# Patient Record
Sex: Male | Born: 1990 | Race: White | Hispanic: No | Marital: Single | Smoking: Current every day smoker
Health system: Southern US, Community
[De-identification: ages and names within clinical notes are randomized; demographics above are authoritative.]

---
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
## 2012-06-08 ENCOUNTER — Emergency Department: Payer: Self-pay | Admitting: *Deleted

## 2013-06-13 ENCOUNTER — Emergency Department: Payer: Self-pay | Admitting: Emergency Medicine

## 2013-06-14 LAB — CBC WITH DIFFERENTIAL/PLATELET
Basophil #: 0 10*3/uL (ref 0.0–0.1)
Basophil %: 0.5 %
Eosinophil #: 0 10*3/uL (ref 0.0–0.7)
HGB: 14.8 g/dL (ref 13.0–18.0)
Lymphocyte %: 26.2 %
MCV: 86 fL (ref 80–100)
Monocyte %: 6.5 %
Neutrophil #: 3.9 10*3/uL (ref 1.4–6.5)
Neutrophil %: 66 %
Platelet: 128 10*3/uL — ABNORMAL LOW (ref 150–440)
RDW: 13.1 % (ref 11.5–14.5)
WBC: 6 10*3/uL (ref 3.8–10.6)

## 2013-06-14 LAB — COMPREHENSIVE METABOLIC PANEL
Albumin: 4.5 g/dL (ref 3.8–5.6)
Alkaline Phosphatase: 111 U/L (ref 98–317)
Anion Gap: 5 — ABNORMAL LOW (ref 7–16)
BUN: 14 mg/dL (ref 7–18)
Bilirubin,Total: 0.4 mg/dL (ref 0.2–1.0)
Chloride: 106 mmol/L (ref 98–107)
Co2: 29 mmol/L (ref 21–32)
Creatinine: 0.99 mg/dL (ref 0.60–1.30)
EGFR (Non-African Amer.): >60
Osmolality: 279 (ref 275–301)
Potassium: 3.8 mmol/L (ref 3.5–5.1)
SGPT (ALT): 77 U/L (ref 12–78)

## 2013-06-14 LAB — MONONUCLEOSIS SCREEN: Mono Test: NEGATIVE

## 2013-06-16 LAB — BETA STREP CULTURE(ARMC)

## 2013-08-21 IMAGING — CR DG RIBS 2V*L*
1 series · 4 of 4 positions shown · non-contrast
Comparison: none

REASON FOR EXAM: pain, trauma
COMMENTS:

PROCEDURE:     DXR - DXR RIBS LEFT UNILATERAL  - June 09, 2012  [DATE]
RESULT:     There is no evidence of fracture, dislocation, or malalignment.

[Series 1: w ribs ap upper left · 0.14mm/px · 4 of 4 slices shown]
[im 1/4]
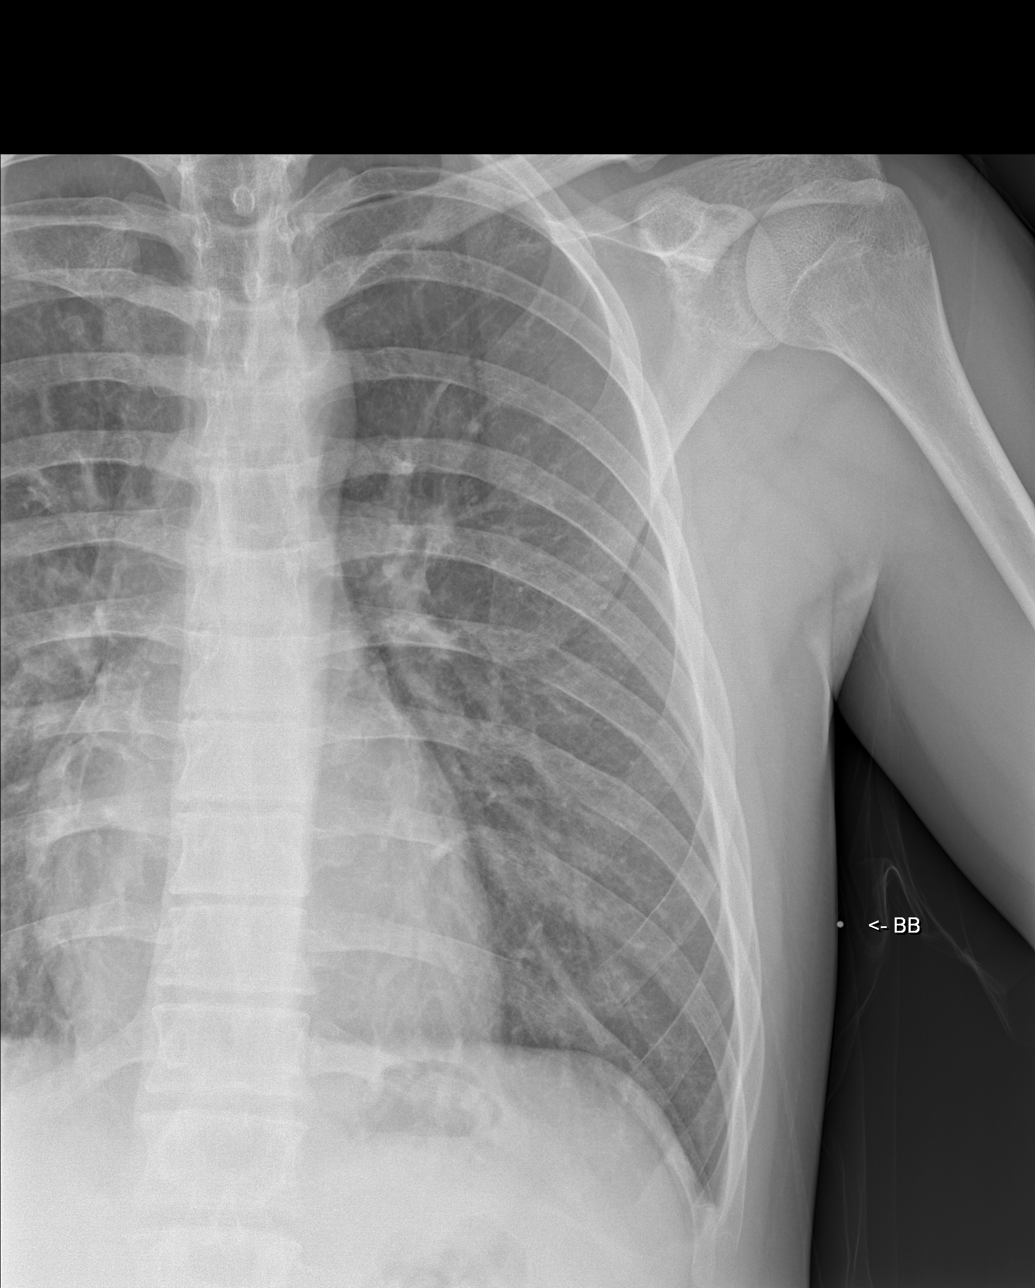
[im 2/4]
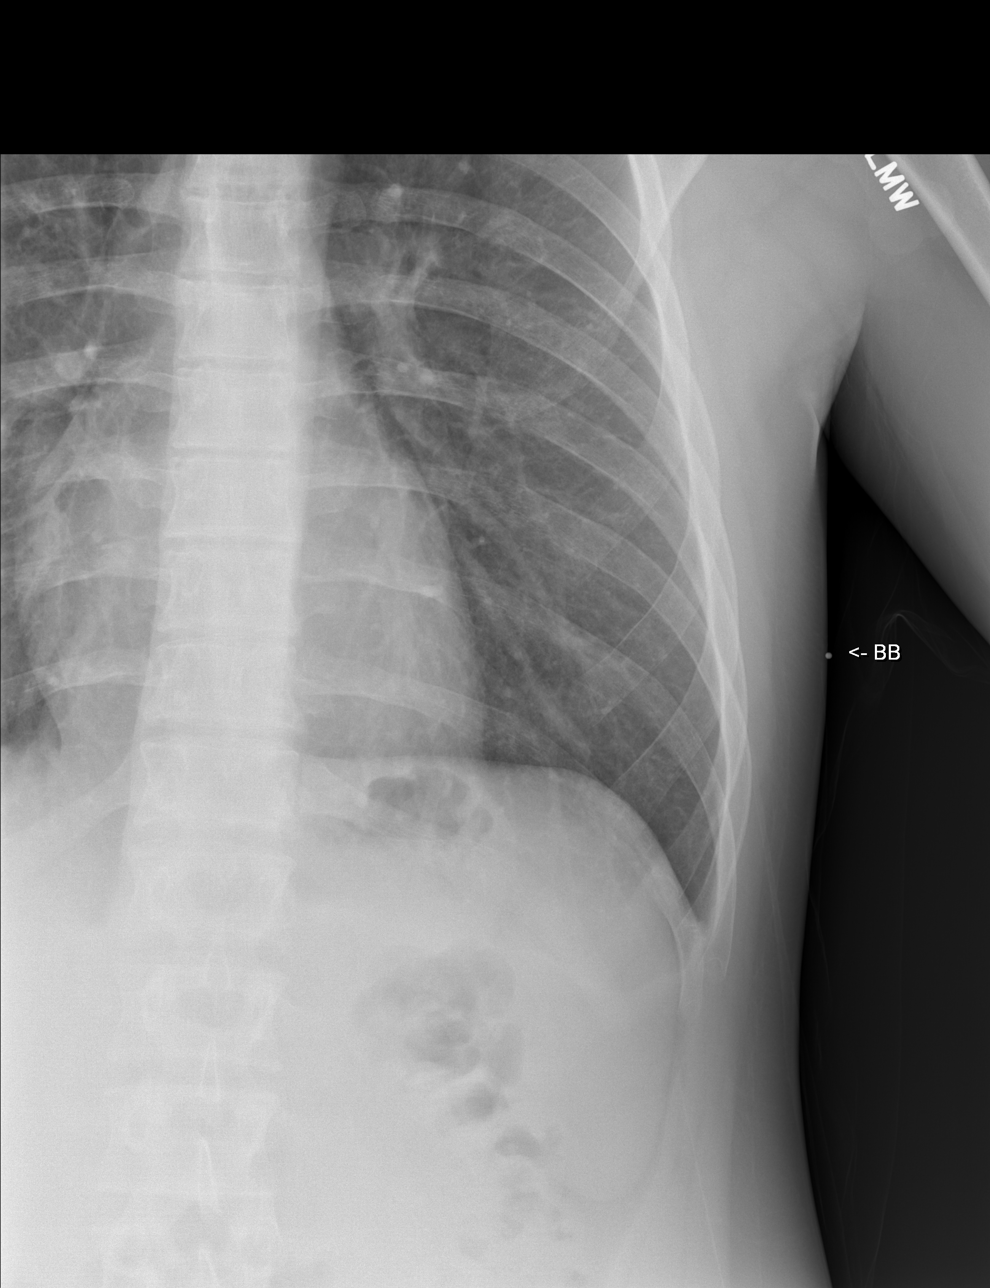
[im 3/4]
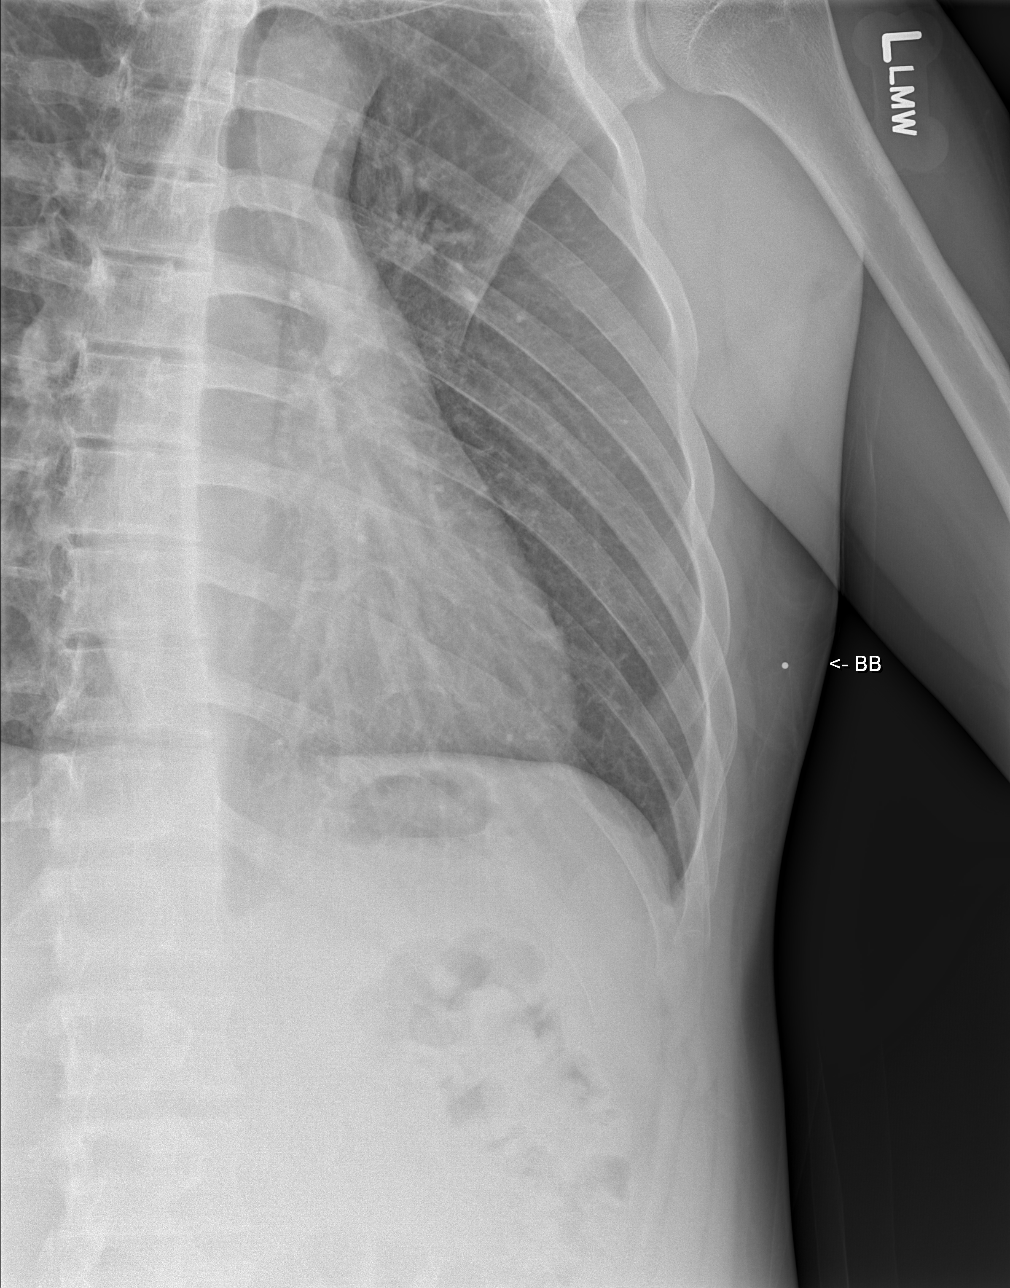
[im 4/4]
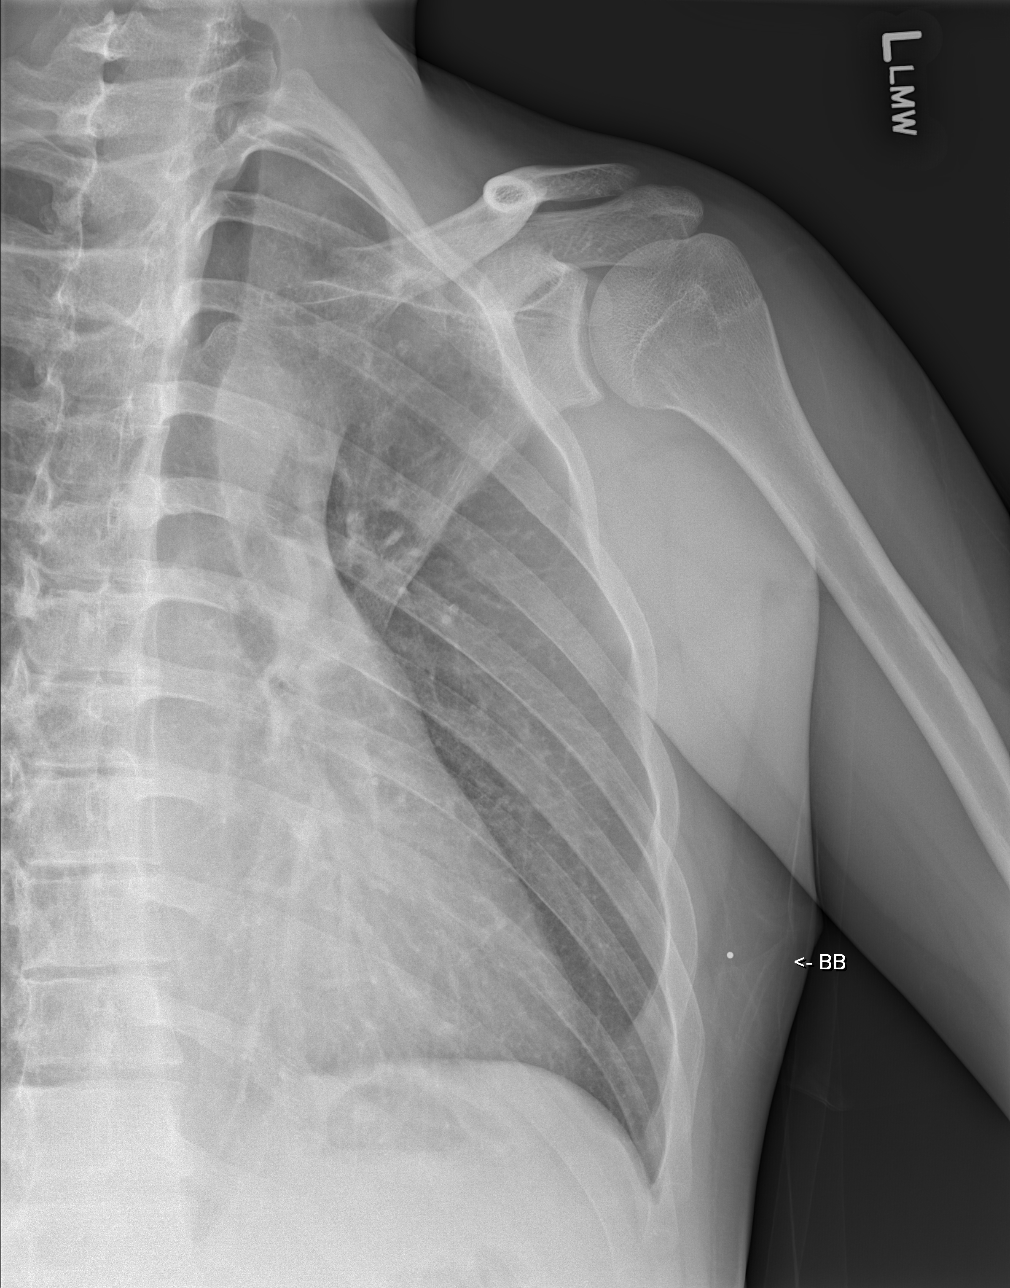

[4 of 4 positions shown; findings below may reference images not displayed]

IMPRESSION: 1. No evidence of acute abnormalities.
2. If there are persistent complaints of pain or persistent clinical
concern, a repeat evaluation in 7-10 days is recommended if clinically
warranted.

## 2014-11-02 ENCOUNTER — Emergency Department: Payer: Self-pay | Admitting: Student

## 2016-01-14 IMAGING — CR DG RIBS 2V*L*
1 series · 4 of 4 positions shown · non-contrast
Comparison: Left rib radiographs 06/09/2012.

CLINICAL DATA: Fell down 10 steps earlier today. Back pain.
Posterior rib but scratch the posterior left rib pain.

EXAM:
LEFT RIBS - 2 VIEW

[Series 1: dxr ribs left unilateral · 0.14mm/px · 4 of 4 slices shown]
[im 1/4]
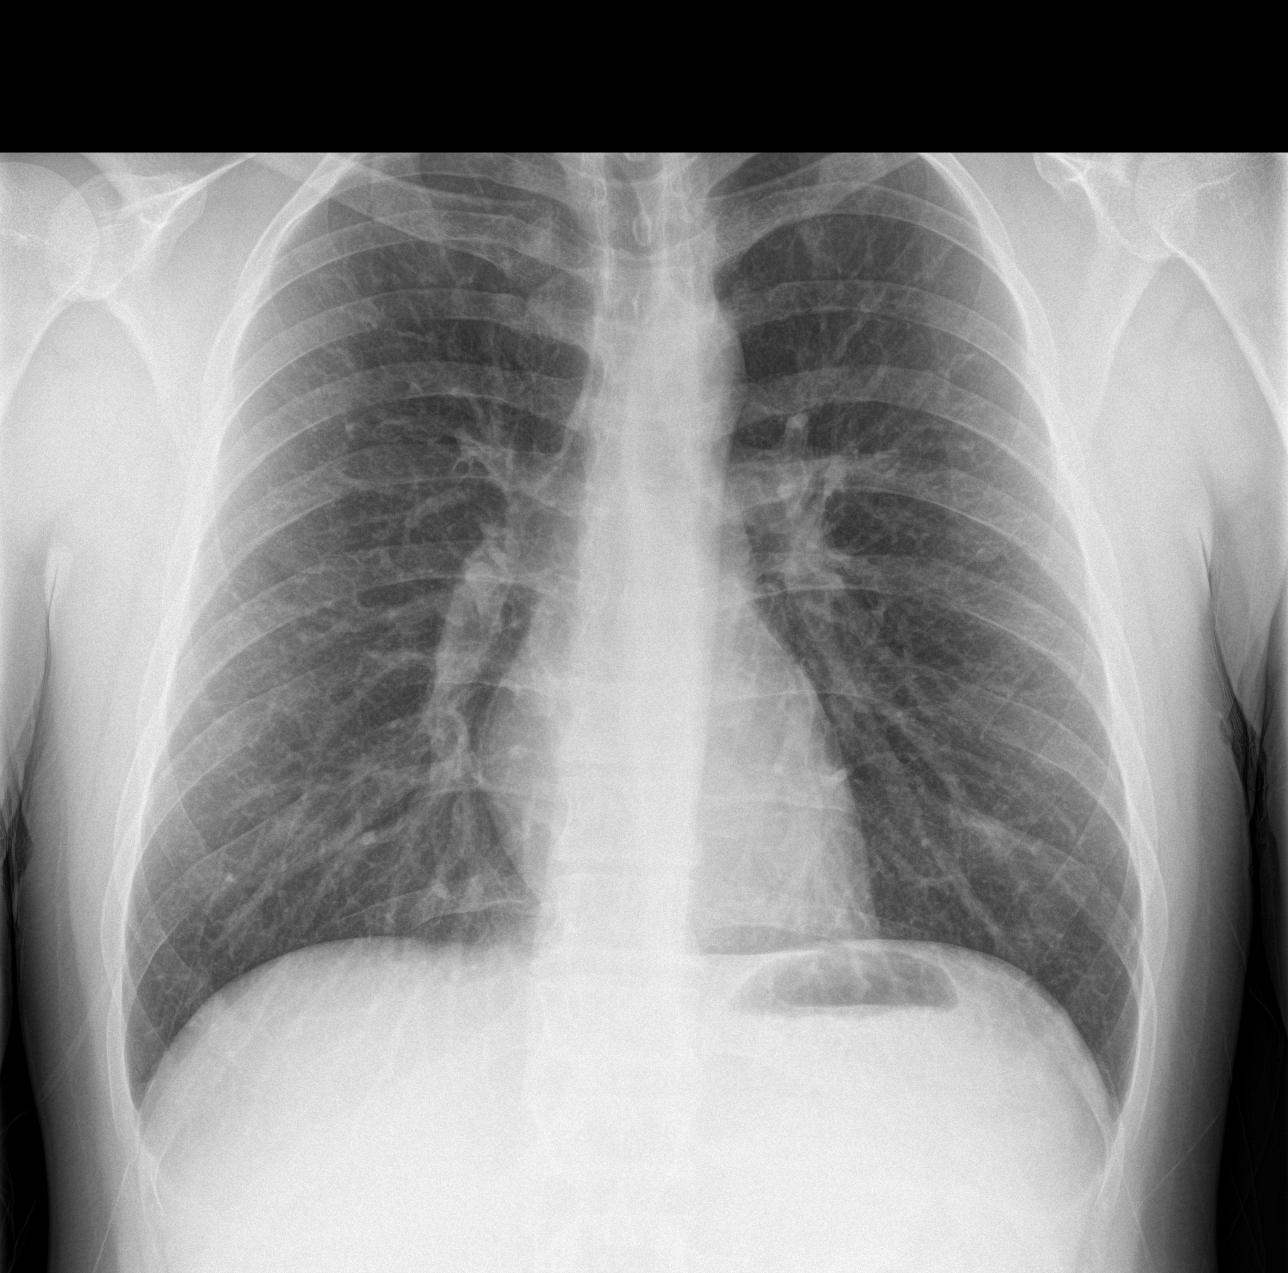
[im 2/4]
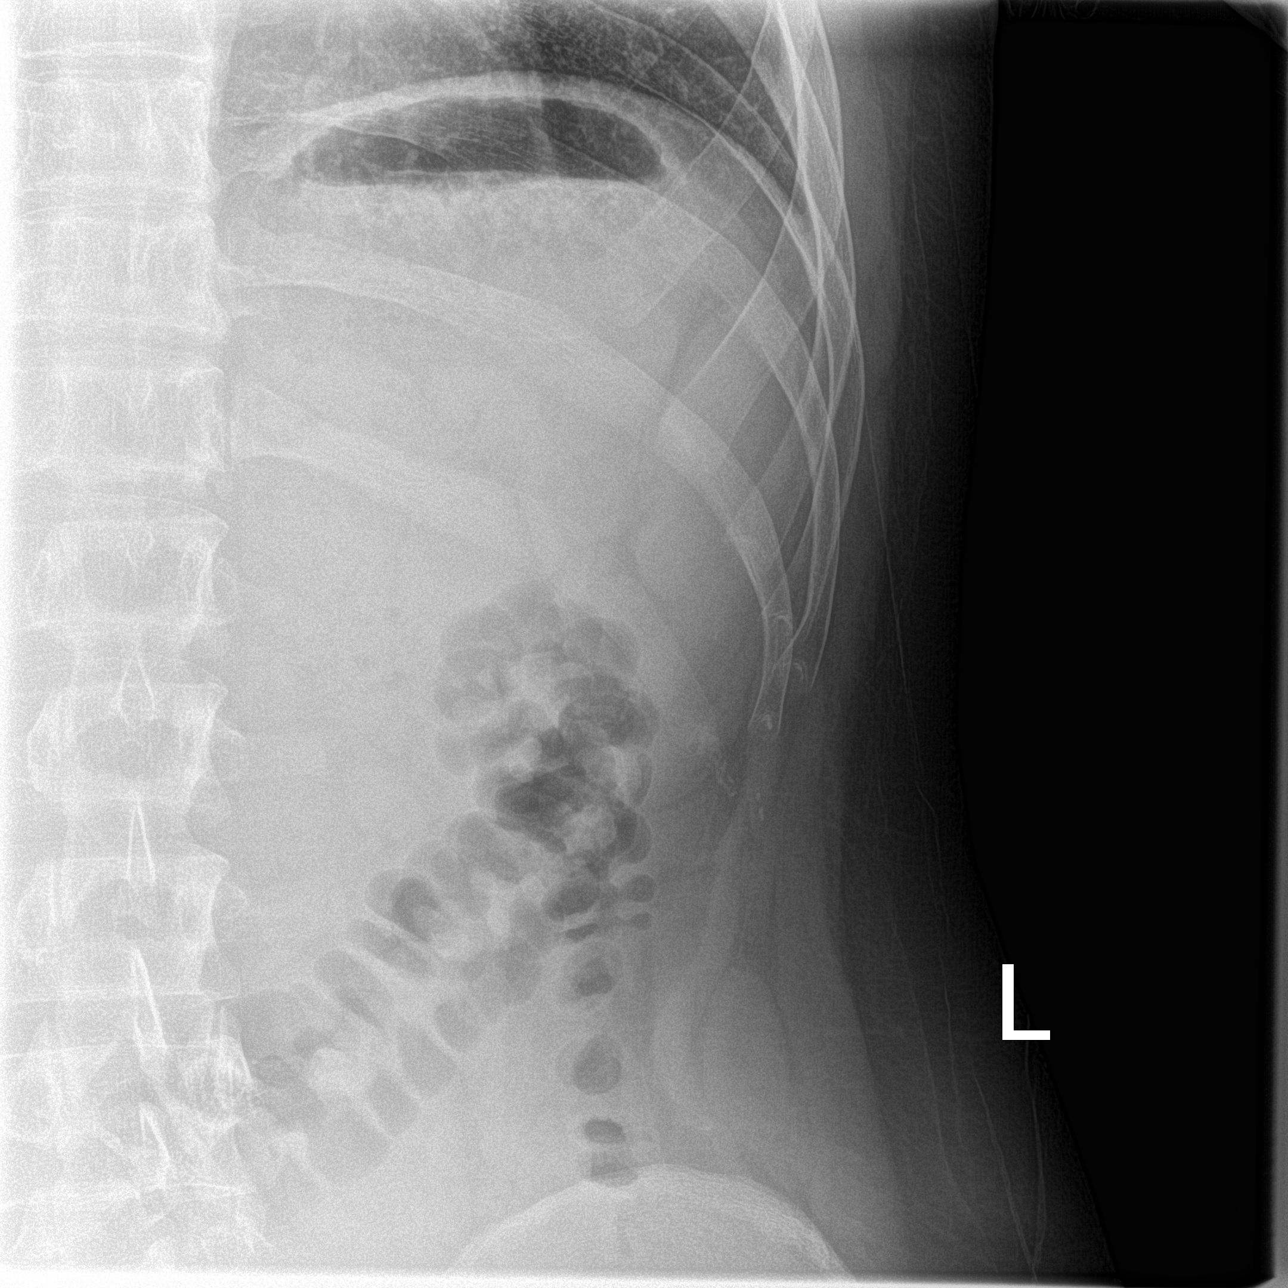
[im 3/4]
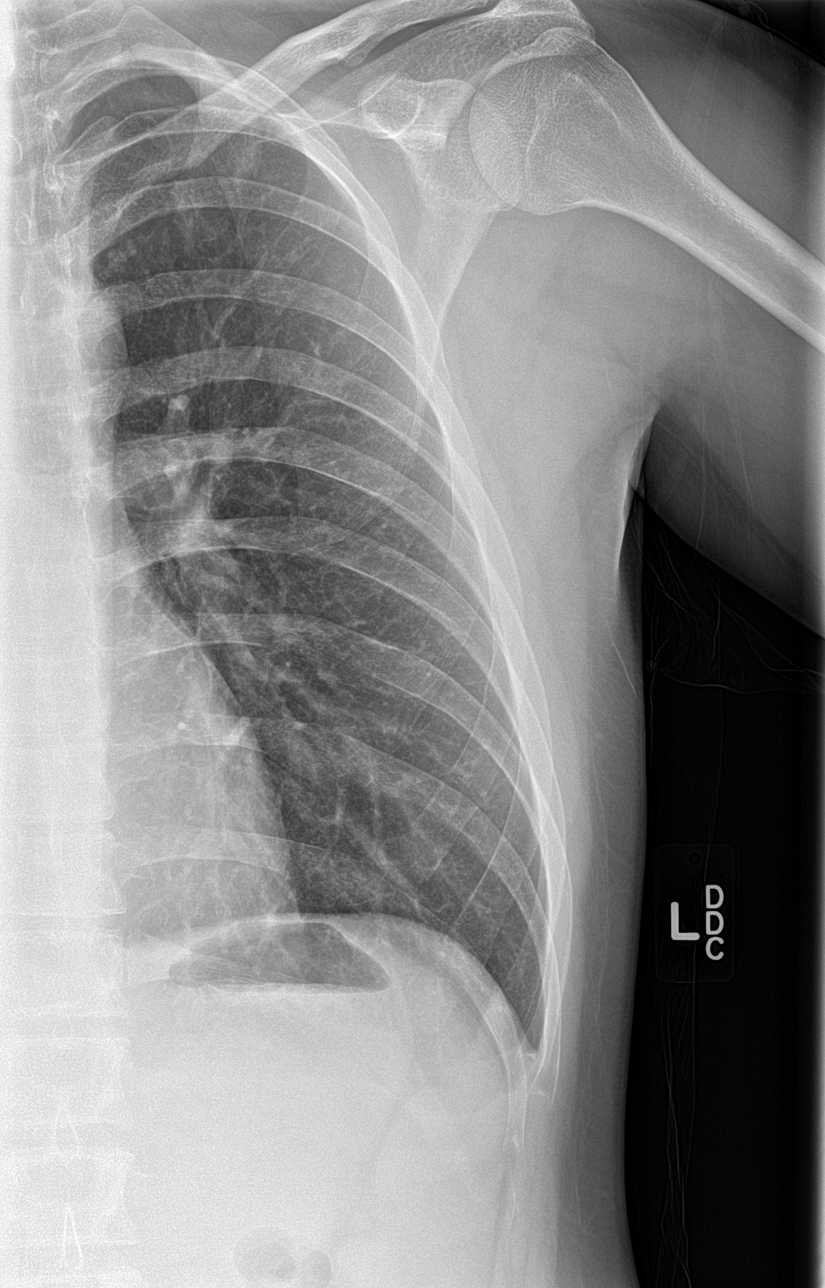
[im 4/4]
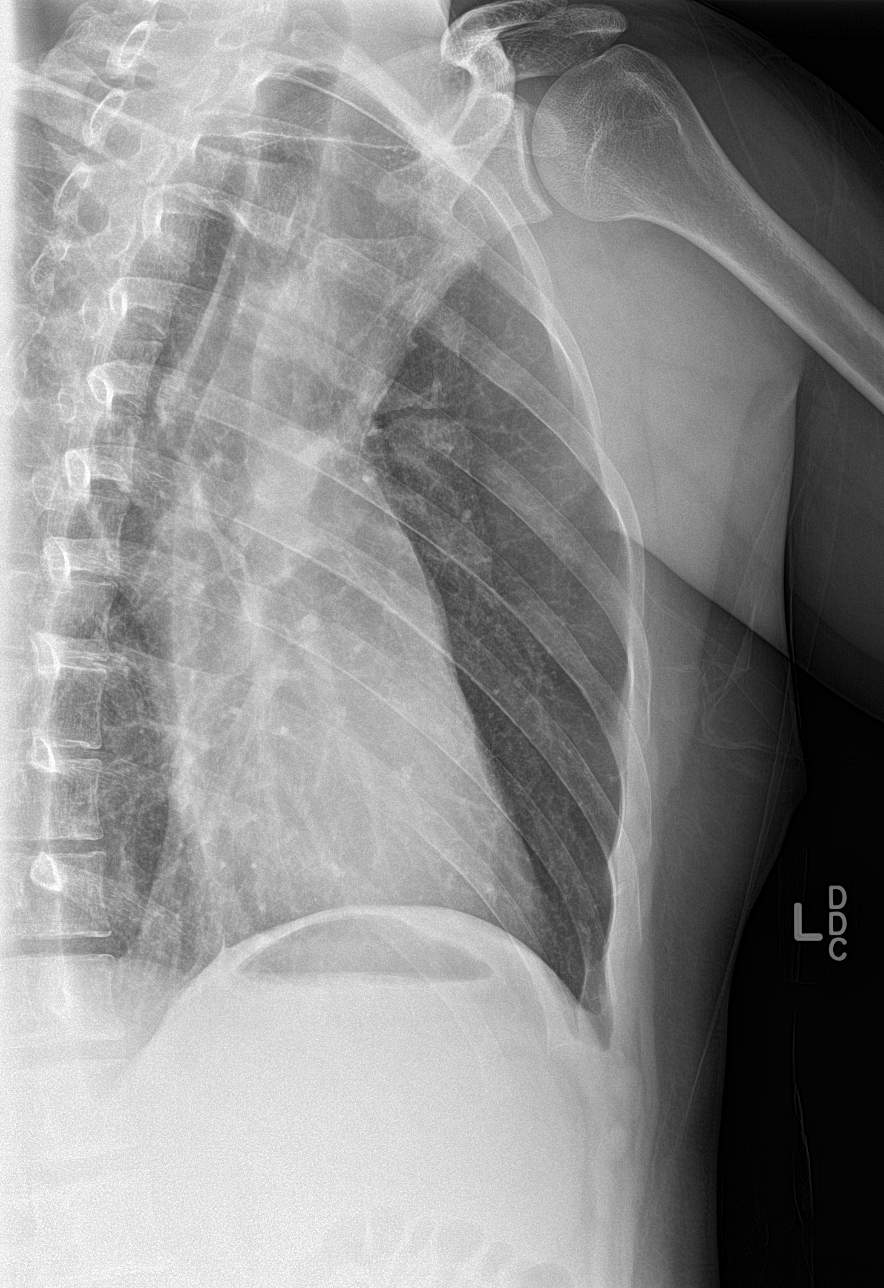

[4 of 4 positions shown; findings below may reference images not displayed]

FINDINGS: The heart size is normal.  The lungs are clear.

Dedicated imaging of the left ribs demonstrates no acute or healing
fracture. There is no pneumothorax. The visualized soft tissues and
bony thorax are otherwise unremarkable.
IMPRESSION: Negative one-view chest x-ray and left ribs.

## 2018-07-02 ENCOUNTER — Emergency Department: Payer: Self-pay

## 2018-07-02 ENCOUNTER — Encounter: Payer: Self-pay | Admitting: Emergency Medicine

## 2018-07-02 DIAGNOSIS — S63502A Unspecified sprain of left wrist, initial encounter: Secondary | ICD-10-CM | POA: Insufficient documentation

## 2018-07-02 DIAGNOSIS — Y929 Unspecified place or not applicable: Secondary | ICD-10-CM | POA: Insufficient documentation

## 2018-07-02 DIAGNOSIS — Y999 Unspecified external cause status: Secondary | ICD-10-CM | POA: Insufficient documentation

## 2018-07-02 DIAGNOSIS — Y9389 Activity, other specified: Secondary | ICD-10-CM | POA: Insufficient documentation

## 2018-07-02 DIAGNOSIS — F17210 Nicotine dependence, cigarettes, uncomplicated: Secondary | ICD-10-CM | POA: Insufficient documentation
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
## 2018-07-03 ENCOUNTER — Emergency Department
Admission: EM | Admit: 2018-07-03 | Discharge: 2018-07-03 | Disposition: A | Discharge status: Home / Self Care | Payer: Self-pay | Attending: Emergency Medicine | Admitting: Emergency Medicine

## 2018-07-03 DIAGNOSIS — S63502A Unspecified sprain of left wrist, initial encounter: Secondary | ICD-10-CM

## 2018-07-03 MED ORDER — TRAMADOL HCL 50 MG PO TABS
50.0000 mg | ORAL_TABLET | Freq: Once | ORAL | Status: AC
  Administered 2018-07-03: 50 mg via ORAL
  Filled 2018-07-03: qty 1

## 2018-07-03 MED ORDER — TRAMADOL HCL 50 MG PO TABS: 50.0000 mg | ORAL_TABLET | Freq: Four times a day (QID) | ORAL | 0 refills | Status: AC | PRN

## 2018-07-03 NOTE — Discharge Instructions (Addendum)
Please seek medical attention for any high fevers, chest pain, shortness of breath, change in behavior, persistent vomiting, bloody stool or any other new or concerning symptoms.  
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
## 2019-10-07 ENCOUNTER — Emergency Department
Admission: EM | Admit: 2019-10-07 | Discharge: 2019-10-07 | Disposition: A | Discharge status: Home / Self Care | Payer: Self-pay | Attending: Emergency Medicine | Admitting: Emergency Medicine

## 2019-10-07 ENCOUNTER — Encounter: Payer: Self-pay | Admitting: Emergency Medicine

## 2019-10-07 ENCOUNTER — Other Ambulatory Visit: Payer: Self-pay

## 2019-10-07 DIAGNOSIS — K0889 Other specified disorders of teeth and supporting structures: Secondary | ICD-10-CM | POA: Insufficient documentation

## 2019-10-07 DIAGNOSIS — F17210 Nicotine dependence, cigarettes, uncomplicated: Secondary | ICD-10-CM | POA: Insufficient documentation

## 2019-10-07 MED ORDER — PENICILLIN V POTASSIUM 500 MG PO TABS: 500.0000 mg | ORAL_TABLET | Freq: Four times a day (QID) | ORAL | 0 refills | Status: DC

## 2019-10-07 NOTE — Discharge Instructions (Addendum)
OPTIONS FOR DENTAL FOLLOW UP CARE ° °[place] Department of Health and Human Services - Local Safety Net Dental Clinics °http://www.ncdhhs.gov/dph/oralhealth/services/safetynetclinics.htm °  °[facility] (336-562-3123) ° °[facility] [place] ([phone]) ° °[facility] ([phone] ext [phone]) ° °[facility] ([phone]) ° °[facility] ([phone]) °This clinic caters to the indigent population and is on a lottery system. °Location: °[facility], [address], [place] °Clinic Hours: °Wednesdays from 6pm - 9pm, patients seen by a lottery system. °For dates, call or go to [email or web address].[facility].edu/[facility]/patients/Dental-[facility] °Services: °Cleanings, fillings and simple extractions. °Payment Options: °DENTAL WORK IS FREE OF CHARGE. Bring proof of income or support. °Best way to get seen: °Arrive at 5:15 pm - this is a lottery, NOT first come/first serve, so arriving earlier will not increase your chances of being seen. °  °  °[facility] °[phone] °Select option 1 for emergencies °  °Location: °[facility], [address], [place] °Clinic Hours: °No walk-ins accepted - call the day before to schedule an appointment. °Check in times are 9:30 am and 1:30 pm. °Services: °Simple extractions, temporary fillings, pulpectomy/pulp debridement, uncomplicated abscess drainage. °Payment Options: °PAYMENT IS DUE AT THE TIME OF SERVICE.  Fee is usually $100-200, additional surgical procedures (e.g. abscess drainage) may be extra. °Cash, checks, Visa/MasterCard accepted.  Can file Medicaid if patient is covered for dental - patient should call case worker to check. °No discount for [facility] patients. °Best way to get seen: °MUST call the day before and get onto the schedule. Can usually be seen the next 1-2 days. No walk-ins accepted. °  °  °[facility] °[phone] °   °Location: °[facility], [address], [place] °Clinic Hours: °M, W, Th, F 8am or 1:30pm, Tues 9a or 1:30 - first come/first served. °Services: °Simple extractions, temporary fillings, uncomplicated abscess drainage.  You do not need to be an [place] resident. °Payment Options: °PAYMENT IS DUE AT THE TIME OF SERVICE. °Dental insurance, otherwise sliding scale - bring proof of income or support. °Depending on income and treatment needed, cost is usually $50-200. °Best way to get seen: °Arrive early as it is first come/first served. °  °  °[facility] °[phone] °  °Location: °[address] °Clinic Hours: °Mon-Thu 8a-5p °Services: °Most basic dental services including extractions and fillings. °Payment Options: °PAYMENT IS DUE AT THE TIME OF SERVICE. °Sliding scale, up to 50% off - bring proof if income or support. °Medicaid with dental option accepted. °Best way to get seen: °Call to schedule an appointment, can usually be seen within 2 weeks OR they will try to see walk-ins - show up at 8a or 2p (you may have to wait). °  °  °[facility] °[phone] °[place] RESIDENTS ONLY °  °Location: °[facility], [address], [place], [place] [address] °Clinic Hours: By appointment only. °Monday - Thursday 8am-5pm, Friday 8am-12pm °Services: Cleanings, fillings, extractions. °Payment Options: °PAYMENT IS DUE AT THE TIME OF SERVICE. °Cash, Visa or MasterCard. Sliding scale - $30 minimum per service. °Best way to get seen: °Come in to office, complete packet and make an appointment - need proof of income °or support monies for each household member and proof of [place] residence. °Usually takes about a month to get in. °  °  °[facility] °[phone] °  °Location: °[address]   [place] °Clinic Hours: Walk-in Urgent Care Dental Services are offered Monday-Friday  mornings only. °The numbers of emergencies accepted daily is limited to the number of °providers available. °Maximum 15 - Mondays, Wednesdays & Thursdays °Maximum 10 - Tuesdays & Fridays °Services: °You do not need to be a [place] resident to be seen for a dental emergency. °Emergencies are defined as pain, swelling, abnormal bleeding, or dental trauma. Walkins will receive x-rays if needed. °NOTE: Dental cleaning is not an emergency. °Payment Options: °PAYMENT IS DUE AT THE TIME OF SERVICE. °Minimum co-pay is $40.00 for uninsured patients. °Minimum co-pay is $3.00 for Medicaid with dental coverage. °Dental Insurance is accepted and must be presented at time of visit. °Medicare does not cover dental. °Forms of payment: Cash, credit card, checks. °Best way to get seen: °If not previously registered with the clinic, walk-in dental registration begins at 7:15 am and is on a first come/first serve basis. °If previously registered with the clinic, call to make an appointment. °  °  °[facility] °[phone] °[place] RESIDENTS ONLY °  °Location: °[address], [place], [place] °Clinic Hours: °Mon-Thu 10a-2p °Services: Extractions only! °Payment Options: °FREE (donations accepted) - bring proof of income or support °Best way to get seen: °Call and schedule an appointment OR come at 8am on the 1st Monday of every month (except for holidays) when it is first come/first served. °  °  °[facility] °[phone] °  °Location: °[address], [place] °Clinic Hours: °Friday mornings °Services, Payment Options, Best way to get seen: °Call for info °

## 2019-10-07 NOTE — ED Notes (Signed)
Says he has a bad tooth on left side with cavity and broke off.  Was ok this am, but went to drink h20 at work and it has become painful.

## 2019-10-07 NOTE — ED Provider Notes (Signed)
[facility] Emergency Department Provider Note  Time seen: 1:41 PM  I have reviewed the triage vital signs and the nursing notes.   HISTORY  Chief Complaint Dental Pain   HPI Adam Travis is a 28 y.o. male with no past medical history presents to the emergency department for left lower dental pain.  According to the patient for many months or possibly years he has had a fractured left lower wisdom tooth.  However he states today while drinking something he felt significant pain to the area which has continued.  Patient denies any fever cough or shortness of breath.   History reviewed. No pertinent past medical history.  There are no active problems to display for this patient.   History reviewed. No pertinent surgical history.  Prior to Admission medications   Medication Sig Start Date End Date Taking? Authorizing Provider  penicillin v potassium (VEETID) 500 MG tablet Take 1 tablet (500 mg total) by mouth 4 (four) times daily. 10/07/19   Minna Antis, MD    No Known Allergies  No family history on file.  Social History Social History   Tobacco Use  . Smoking status: Current Every Day Smoker  . Smokeless tobacco: Never Used  Substance Use Topics  . Alcohol use: Not Currently  . Drug use: Not Currently    Review of Systems Constitutional: Negative for fever. Cardiovascular: Negative for chest pain. Respiratory: Negative for shortness of breath. Gastrointestinal: Negative for abdominal pain Musculoskeletal: Negative for musculoskeletal complaints Neurological: Negative for headache All other ROS negative  ____________________________________________   PHYSICAL EXAM:  VITAL SIGNS: ED Triage Vitals  Enc Vitals Group     BP 10/07/19 1301 107/68     Pulse Rate 10/07/19 1301 63     Resp 10/07/19 1301 20     Temp 10/07/19 1301 98.3 F (36.8 C)     Temp Source 10/07/19 1301 Oral     SpO2 10/07/19 1301 99 %     Weight 10/07/19 1259  200 lb (90.7 kg)     Height 10/07/19 1259 6\' 1"  (1.854 m)     Head Circumference --      Peak Flow --      Pain Score --      Pain Loc --      Pain Edu? --      Excl. in GC? --    Constitutional: Alert and oriented. Well appearing and in no distress. Eyes: Normal exam ENT      Head: Normocephalic and atraumatic.      Mouth/Throat: Mucous membranes are moist.  Patient has a fractured left lower wisdom tooth that extends below the gumline.  No signs of abscess. Cardiovascular: Normal rate, regular rhythm.  Respiratory: Normal respiratory effort without tachypnea nor retractions. Breath sounds are clear  Gastrointestinal: Soft and nontender. No distention.   Musculoskeletal: Nontender with normal range of motion in all extremities. Neurologic:  Normal speech and language. No gross focal neurologic deficits  Skin:  Skin is warm, dry and intact.  Psychiatric: Mood and affect are normal.   ____________________________________________   INITIAL IMPRESSION / ASSESSMENT AND PLAN / ED COURSE  Pertinent labs & imaging results that were available during my care of the patient were reviewed by me and considered in my medical decision making (see chart for details).   Patient presents to the emergency department for left lower molar pain has a fracture extending below the gumline.  No sign of abscess.  We will place  the patient on antibiotics penicillin 4 times daily for the next 10 days.  Patient does not currently have a dentist I provided a list of local dental clinics for the patient.  I discussed my normal return precautions and emphasized the need to follow-up with a dentist as soon as possible.  Patient agreeable to plan of care.  Adam Travis was evaluated in Emergency Department on 10/07/2019 for the symptoms described in the history of present illness. He was evaluated in the context of the global COVID-19 pandemic, which necessitated consideration that the patient might be at risk for  infection with the SARS-CoV-2 virus that causes COVID-19. Institutional protocols and algorithms that pertain to the evaluation of patients at risk for COVID-19 are in a state of rapid change based on information released by regulatory bodies including the CDC and federal and state organizations. These policies and algorithms were followed during the patient's care in the ED.  ____________________________________________   FINAL CLINICAL IMPRESSION(S) / ED DIAGNOSES  Dental pain Dental fracture   Harvest Dark, MD 10/07/19 1343

## 2019-10-07 NOTE — ED Triage Notes (Signed)
Pt reports pain to his left lower wisdom teeth. Pt reports does not have a dentist at this time.

## 2020-08-10 ENCOUNTER — Encounter: Payer: Self-pay | Admitting: Emergency Medicine

## 2020-08-10 ENCOUNTER — Emergency Department
Admission: EM | Admit: 2020-08-10 | Discharge: 2020-08-10 | Disposition: A | Discharge status: Home / Self Care | Payer: Self-pay | Attending: Emergency Medicine | Admitting: Emergency Medicine

## 2020-08-10 ENCOUNTER — Other Ambulatory Visit: Payer: Self-pay

## 2020-08-10 DIAGNOSIS — L237 Allergic contact dermatitis due to plants, except food: Secondary | ICD-10-CM | POA: Insufficient documentation

## 2020-08-10 DIAGNOSIS — F17200 Nicotine dependence, unspecified, uncomplicated: Secondary | ICD-10-CM | POA: Insufficient documentation

## 2020-08-10 MED ORDER — DEXAMETHASONE SODIUM PHOSPHATE 10 MG/ML IJ SOLN
10.0000 mg | Freq: Once | INTRAMUSCULAR | Status: AC
  Administered 2020-08-10: 10 mg via INTRAMUSCULAR
  Filled 2020-08-10: qty 1

## 2020-08-10 MED ORDER — PREDNISONE 10 MG (48) PO TBPK: ORAL_TABLET | Freq: Every day | ORAL | 0 refills | Status: AC

## 2020-08-10 NOTE — ED Triage Notes (Signed)
Pt reports poison ivy rash over his body for 2 days continuing to spread and get worse. Pt states has been using OTC lotions with no relief.

## 2020-08-10 NOTE — ED Provider Notes (Signed)
[facility] Emergency Department Provider Note   ____________________________________________   First MD Initiated Contact with Patient 08/10/20 [phone]     (approximate)  I have reviewed the triage vital signs and the nursing notes.   HISTORY  Chief Complaint Poison Ivy   HPI Cardale R Kroft is a 29 y.o. male presents to the ED with complaint of poison ivy rash on his body for 2 days that is spreading.  Patient has been using over-the-counter lotions without any relief.       History reviewed. No pertinent past medical history.  There are no problems to display for this patient.   History reviewed. No pertinent surgical history.  Prior to Admission medications   Medication Sig Start Date End Date Taking? Authorizing Provider  predniSONE (STERAPRED UNI-PAK 48 TAB) 10 MG (48) TBPK tablet Take by mouth daily. 08/10/20   Tommi Rumps, PA-C    Allergies Patient has no known allergies.  History reviewed. No pertinent family history.  Social History Social History   Tobacco Use  . Smoking status: Current Every Day Smoker  . Smokeless tobacco: Never Used  Substance Use Topics  . Alcohol use: Not Currently  . Drug use: Not Currently    Review of Systems Constitutional: No fever/chills Eyes: No visual changes. Cardiovascular: Denies chest pain. Respiratory: Denies shortness of breath. Gastrointestinal: No abdominal pain.  No nausea, no vomiting.  Musculoskeletal: Negative for muscle aches. Skin: Positive for rash. Neurological: Negative for headaches, focal weakness or numbness. ____________________________________________   PHYSICAL EXAM:  VITAL SIGNS: ED Triage Vitals [08/10/20 0837]  Enc Vitals Group     BP      Pulse      Resp      Temp      Temp src      SpO2      Weight 220 lb (99.8 kg)     Height 6\' 1"  (1.854 m)     Head Circumference      Peak Flow      Pain Score 0     Pain Loc      Pain Edu?      Excl. in GC?       Constitutional: Alert and oriented. Well appearing and in no acute distress. Eyes: Conjunctivae are normal.  Head: Atraumatic. Nose: No congestion/rhinnorhea. Neck: No stridor.   Cardiovascular: Normal rate, regular rhythm. Grossly normal heart sounds.  Good peripheral circulation. Respiratory: Normal respiratory effort.  No retractions. Lungs CTAB. Musculoskeletal: Moves upper and lower extremities they have difficulty.  Normal gait was noted.. Neurologic:  Normal speech and language. No gross focal neurologic deficits are appreciated. No gait instability. Skin:  Skin is warm, dry.  There are multiple areas of erythematous papules and vesicles present consistent with contact dermatitis. Psychiatric: Mood and affect are normal. Speech and behavior are normal.  ____________________________________________   LABS (all labs ordered are listed, but only abnormal results are displayed)  Labs Reviewed - No data to display  PROCEDURES  Procedure(s) performed (including Critical Care):  Procedures   ____________________________________________   INITIAL IMPRESSION / ASSESSMENT AND PLAN / ED COURSE  As part of my medical decision making, I reviewed the following data within the electronic MEDICAL RECORD NUMBER Notes from prior ED visits and [place] Controlled Substance Database  29 year old male presents to the ED with complaint of exposure to poison ivy with a rash that it continues to spread for the last 2 days.  He has been using over-the-counter  medication without any relief.  Patient was given Decadron 10 mg IM while in the ED.  A Sterapred Dosepak for 12 days was sent to the pharmacy.  He is also aware that he can take Benadryl with this medication if needed for itching.   ____________________________________________   FINAL CLINICAL IMPRESSION(S) / ED DIAGNOSES  Final diagnoses:  Poison oak dermatitis     ED Discharge Orders         Ordered    predniSONE (STERAPRED UNI-PAK  48 TAB) 10 MG (48) TBPK tablet  Daily        08/10/20 1017          *Please note:  Myers R Calvin was evaluated in Emergency Department on 08/10/2020 for the symptoms described in the history of present illness. He was evaluated in the context of the global COVID-19 pandemic, which necessitated consideration that the patient might be at risk for infection with the SARS-CoV-2 virus that causes COVID-19. Institutional protocols and algorithms that pertain to the evaluation of patients at risk for COVID-19 are in a state of rapid change based on information released by regulatory bodies including the CDC and federal and state organizations. These policies and algorithms were followed during the patient's care in the ED.  Some ED evaluations and interventions may be delayed as a result of limited staffing during and the pandemic.*   Note:  This document was prepared using Dragon voice recognition software and may include unintentional dictation errors.    Tommi Rumps, PA-C 08/10/20 1428    Merwyn Katos, MD 08/10/20 [phone]

## 2020-08-10 NOTE — ED Notes (Signed)
No reaction noted to the injection site at this time

## 2020-08-10 NOTE — Discharge Instructions (Signed)
Follow-up with your primary care provider or one of the dermatologist listed on your discharge papers.  An injection of Decadron was given to you while in the ED.  Begin taking the prednisone as directed beginning with 6 tablets and tapering down each day as directed.  You may also take Benadryl at night to prevent itching.  You can take 2 tablets which is 50 mg before bedtime.

## 2020-11-10 ENCOUNTER — Emergency Department
Admission: EM | Admit: 2020-11-10 | Discharge: 2020-11-10 | Disposition: A | Discharge status: Home / Self Care | Payer: Self-pay | Attending: Emergency Medicine | Admitting: Emergency Medicine

## 2020-11-10 ENCOUNTER — Encounter: Payer: Self-pay | Admitting: Emergency Medicine

## 2020-11-10 ENCOUNTER — Other Ambulatory Visit: Payer: Self-pay

## 2020-11-10 ENCOUNTER — Emergency Department: Payer: Self-pay

## 2020-11-10 DIAGNOSIS — F17200 Nicotine dependence, unspecified, uncomplicated: Secondary | ICD-10-CM | POA: Insufficient documentation

## 2020-11-10 DIAGNOSIS — S39012A Strain of muscle, fascia and tendon of lower back, initial encounter: Secondary | ICD-10-CM | POA: Insufficient documentation

## 2020-11-10 DIAGNOSIS — X500XXA Overexertion from strenuous movement or load, initial encounter: Secondary | ICD-10-CM | POA: Insufficient documentation

## 2020-11-10 DIAGNOSIS — Y990 Civilian activity done for income or pay: Secondary | ICD-10-CM | POA: Insufficient documentation

## 2020-11-10 MED ORDER — ORPHENADRINE CITRATE 30 MG/ML IJ SOLN
60.0000 mg | Freq: Two times a day (BID) | INTRAMUSCULAR | Status: DC
  Administered 2020-11-10: 60 mg via INTRAMUSCULAR
  Filled 2020-11-10: qty 2

## 2020-11-10 MED ORDER — HYDROMORPHONE HCL 1 MG/ML IJ SOLN
1.0000 mg | Freq: Once | INTRAMUSCULAR | Status: AC
  Administered 2020-11-10: 1 mg via INTRAMUSCULAR
  Filled 2020-11-10: qty 1

## 2020-11-10 MED ORDER — KETOROLAC TROMETHAMINE 60 MG/2ML IM SOLN
60.0000 mg | Freq: Once | INTRAMUSCULAR | Status: AC
  Administered 2020-11-10: 60 mg via INTRAMUSCULAR
  Filled 2020-11-10: qty 2

## 2020-11-10 MED ORDER — TRAMADOL HCL 50 MG PO TABS: 50.0000 mg | ORAL_TABLET | Freq: Four times a day (QID) | ORAL | 0 refills | Status: AC | PRN

## 2020-11-10 MED ORDER — IBUPROFEN 600 MG PO TABS: 600.0000 mg | ORAL_TABLET | Freq: Three times a day (TID) | ORAL | 0 refills | Status: AC | PRN

## 2020-11-10 MED ORDER — CYCLOBENZAPRINE HCL 10 MG PO TABS: 10.0000 mg | ORAL_TABLET | Freq: Three times a day (TID) | ORAL | 0 refills | Status: AC | PRN

## 2020-11-10 NOTE — ED Triage Notes (Signed)
C/O low back pain this morning.  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

## 2020-11-10 NOTE — ED Provider Notes (Signed)
[facility] Emergency Department Provider Note   ____________________________________________   Event Date/Time   First MD Initiated Contact with Patient 11/10/20 1032     (approximate)  I have reviewed the triage vital signs and the nursing notes.   HISTORY  Chief Complaint Back Pain    HPI Adam Travis is a 29 y.o. male patient complain of low back pain secondary to a heavy lifting incident which occurred yesterday.  Patient awakened this morning for increased back pain.  Patient denies radicular component to his back pain.  Patient denies bladder or bowel dysfunction.  Patient rates pain as a 10/10.  Patient described pain as "achy/spasmatic".  No palliative measure for complaint.         History reviewed. No pertinent past medical history.  There are no problems to display for this patient.   History reviewed. No pertinent surgical history.  Prior to Admission medications   Medication Sig Start Date End Date Taking? Authorizing Provider  cyclobenzaprine (FLEXERIL) 10 MG tablet Take 1 tablet (10 mg total) by mouth 3 (three) times daily as needed. 11/10/20   Joni Reining, PA-C  ibuprofen (ADVIL) 600 MG tablet Take 1 tablet (600 mg total) by mouth every 8 (eight) hours as needed. 11/10/20   Joni Reining, PA-C  predniSONE (STERAPRED UNI-PAK 48 TAB) 10 MG (48) TBPK tablet Take by mouth daily. 08/10/20   Tommi Rumps, PA-C  traMADol (ULTRAM) 50 MG tablet Take 1 tablet (50 mg total) by mouth every 6 (six) hours as needed for moderate pain. 11/10/20   Joni Reining, PA-C    Allergies Patient has no known allergies.  No family history on file.  Social History Social History   Tobacco Use  . Smoking status: Current Every Day Smoker  . Smokeless tobacco: Never Used  Substance Use Topics  . Alcohol use: Not Currently  . Drug use: Not Currently    Review of Systems Constitutional: No fever/chills Eyes: No visual changes. ENT:  No sore throat. Cardiovascular: Denies chest pain. Respiratory: Denies shortness of breath. Gastrointestinal: No abdominal pain.  No nausea, no vomiting.  No diarrhea.  No constipation. Genitourinary: Negative for dysuria. Musculoskeletal: Positive for back pain. Skin: Negative for rash. Neurological: Negative for headaches, focal weakness or numbness.  ____________________________________________   PHYSICAL EXAM:  VITAL SIGNS: ED Triage Vitals  Enc Vitals Group     BP 11/10/20 0924 119/66     Pulse Rate 11/10/20 0924 94     Resp 11/10/20 0924 16     Temp 11/10/20 0924 98 F (36.7 C)     Temp Source 11/10/20 0924 Oral     SpO2 11/10/20 0924 98 %     Weight 11/10/20 0922 220 lb 0.3 oz (99.8 kg)     Height 11/10/20 0922 6\' 1"  (1.854 m)     Head Circumference --      Peak Flow --      Pain Score 11/10/20 0922 10     Pain Loc --      Pain Edu? --      Excl. in GC? --    Constitutional: Alert and oriented. Well appearing and in no acute distress. Eyes: Conjunctivae are normal. PERRL. EOMI. Head: Atraumatic. Nose: No congestion/rhinnorhea. Mouth/Throat: Mucous membranes are moist.  Oropharynx non-erythematous. Neck: No stridor.  No cervical spine tenderness to palpation. Hematological/Lymphatic/Immunilogical: No cervical lymphadenopathy. Cardiovascular: Normal rate, regular rhythm. Grossly normal heart sounds.  Good peripheral circulation. Respiratory: Normal respiratory  effort.  No retractions. Lungs CTAB. Gastrointestinal: Soft and nontender. No distention. No abdominal bruits. No CVA tenderness. Musculoskeletal: Has deformity of the lumbar spine.  Patient is moderate guarding palpation 4 through S1.  Patient negative straight leg test bilaterally in the supine position. Neurologic:  Normal speech and language. No gross focal neurologic deficits are appreciated. No gait instability. Skin:  Skin is warm, dry and intact. No rash noted. Psychiatric: Mood and affect are normal.  Speech and behavior are normal.  ____________________________________________   LABS (all labs ordered are listed, but only abnormal results are displayed)  Labs Reviewed - No data to display ____________________________________________  EKG   ____________________________________________  RADIOLOGY I, Joni Reining, personally viewed and evaluated these images (plain radiographs) as part of my medical decision making, as well as reviewing the written report by the radiologist.  ED MD interpretation: No acute findings on x-ray of the lumbar spine.  Official radiology report(s): DG Lumbar Spine 2-3 Views  Result Date: 11/10/2020 CLINICAL DATA:  Acute onset low back pain secondary to lifting a heavy object yesterday. EXAM: LUMBAR SPINE - 2-3 VIEW COMPARISON:  None. FINDINGS: There is transitional lumbosacral anatomy with partial lumbarization of S1 and a rudimentary disc at S1-2. No fracture is identified. There may be minimal anterolisthesis of S1 on S2. Mild disc space narrowing is present at L4-5 and L5-S1. IMPRESSION: 1. No acute osseous abnormality identified. 2. Mild lower lumbar disc degeneration. Electronically Signed   By: Sebastian Ache M.D.   On: 11/10/2020 11:12    ____________________________________________   PROCEDURES  Procedure(s) performed (including Critical Care):  Procedures   ____________________________________________   INITIAL IMPRESSION / ASSESSMENT AND PLAN / ED COURSE  As part of my medical decision making, I reviewed the following data within the electronic MEDICAL RECORD NUMBER         She presents with acute low back pain secondary to heavy lifting incident yesterday.  Discussed no acute findings on x-ray of the lumbar spine.  Patient complaint physical exam consistent with acute lumbar strain.  Patient given discharge care instruction advised on drug effects of pain medication and muscle relaxers.  Patient [facility]      ____________________________________________   FINAL CLINICAL IMPRESSION(S) / ED DIAGNOSES  Final diagnoses:  Strain of lumbar region, initial encounter     ED Discharge Orders         Ordered    traMADol (ULTRAM) 50 MG tablet  Every 6 hours PRN        11/10/20 1121    cyclobenzaprine (FLEXERIL) 10 MG tablet  3 times daily PRN        11/10/20 1121    ibuprofen (ADVIL) 600 MG tablet  Every 8 hours PRN        11/10/20 1121          *Please note:  Adam Travis was evaluated in Emergency Department on 11/10/2020 for the symptoms described in the history of present illness. He was evaluated in the context of the global COVID-19 pandemic, which necessitated consideration that the patient might be at risk for infection with the SARS-CoV-2 virus that causes COVID-19. Institutional protocols and algorithms that pertain to the evaluation of patients at risk for COVID-19 are in a state of rapid change based on information released by regulatory bodies including the CDC and federal and state organizations. These policies and algorithms were followed during the patient's care in the ED.  Some ED evaluations and  interventions may be delayed as a result of limited staffing during and the pandemic.*   Note:  This document was prepared using Dragon voice recognition software and may include unintentional dictation errors.    Joni Reining, PA-C 11/10/20 1133    Shaune Pollack, MD 11/10/20 [phone]

## 2020-11-10 NOTE — ED Notes (Signed)
Pt c/o back pain across the lower back since picking up a concrete mixer yesterday, states it is non radiating pain.

## 2020-11-10 NOTE — Discharge Instructions (Signed)
No acute findings x-ray of the lumbar spine.  Follow discharge care instructions take medication as directed.  Be advised most relaxers and pain medication may cause drowsiness.  Do not operate machinery or drive while taking these medications.

## 2022-01-22 IMAGING — CR DG LUMBAR SPINE 2-3V
3 series · 3 of 3 positions shown · non-contrast
Comparison: None.

CLINICAL DATA: Acute onset low back pain secondary to lifting a
heavy object yesterday.

EXAM:
LUMBAR SPINE - 2-3 VIEW

[l-spine ap]
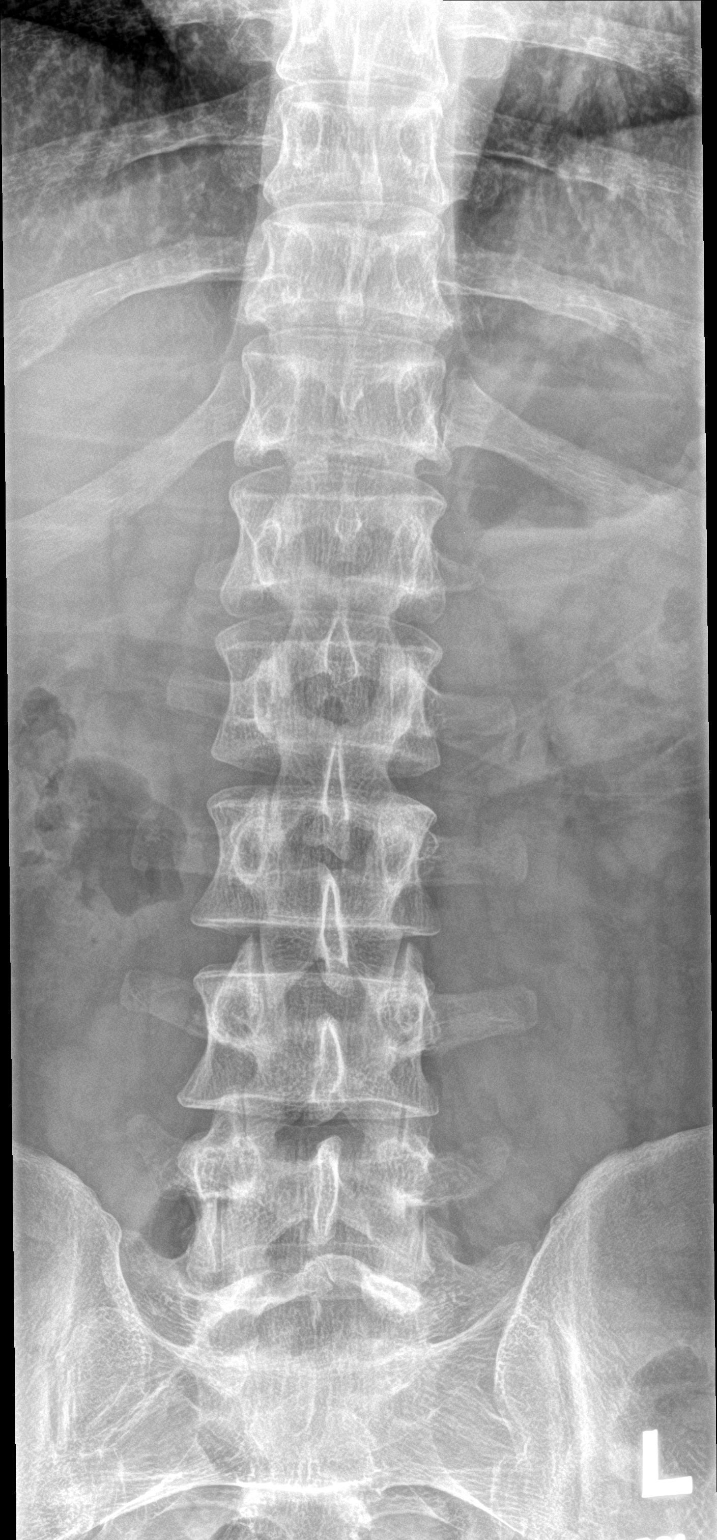

[l-spine lat]
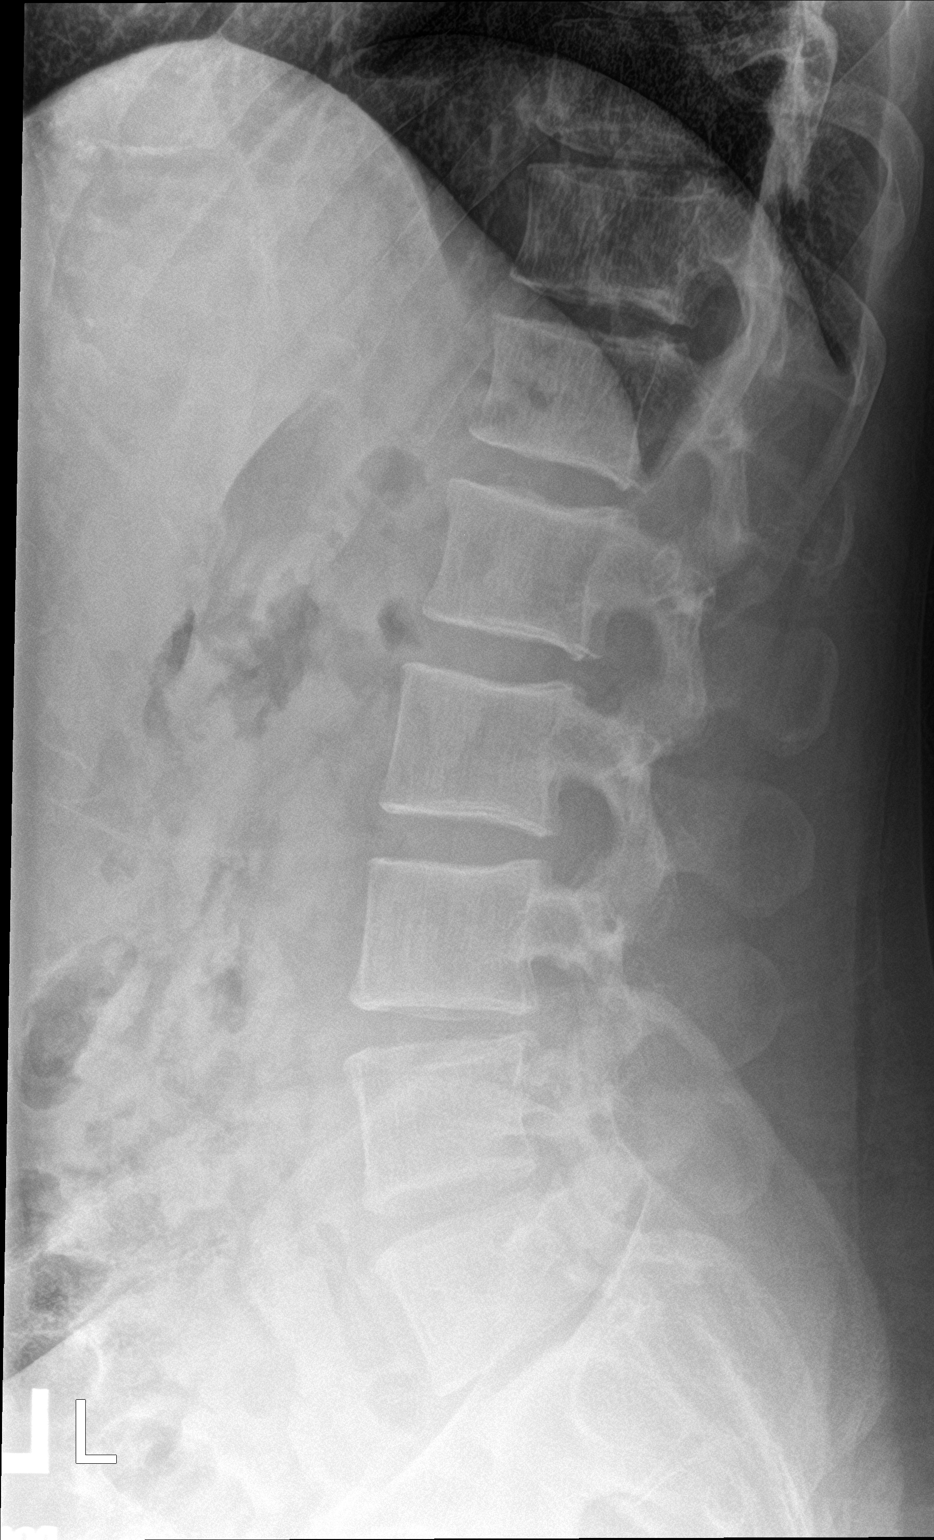

[l-spine spot]
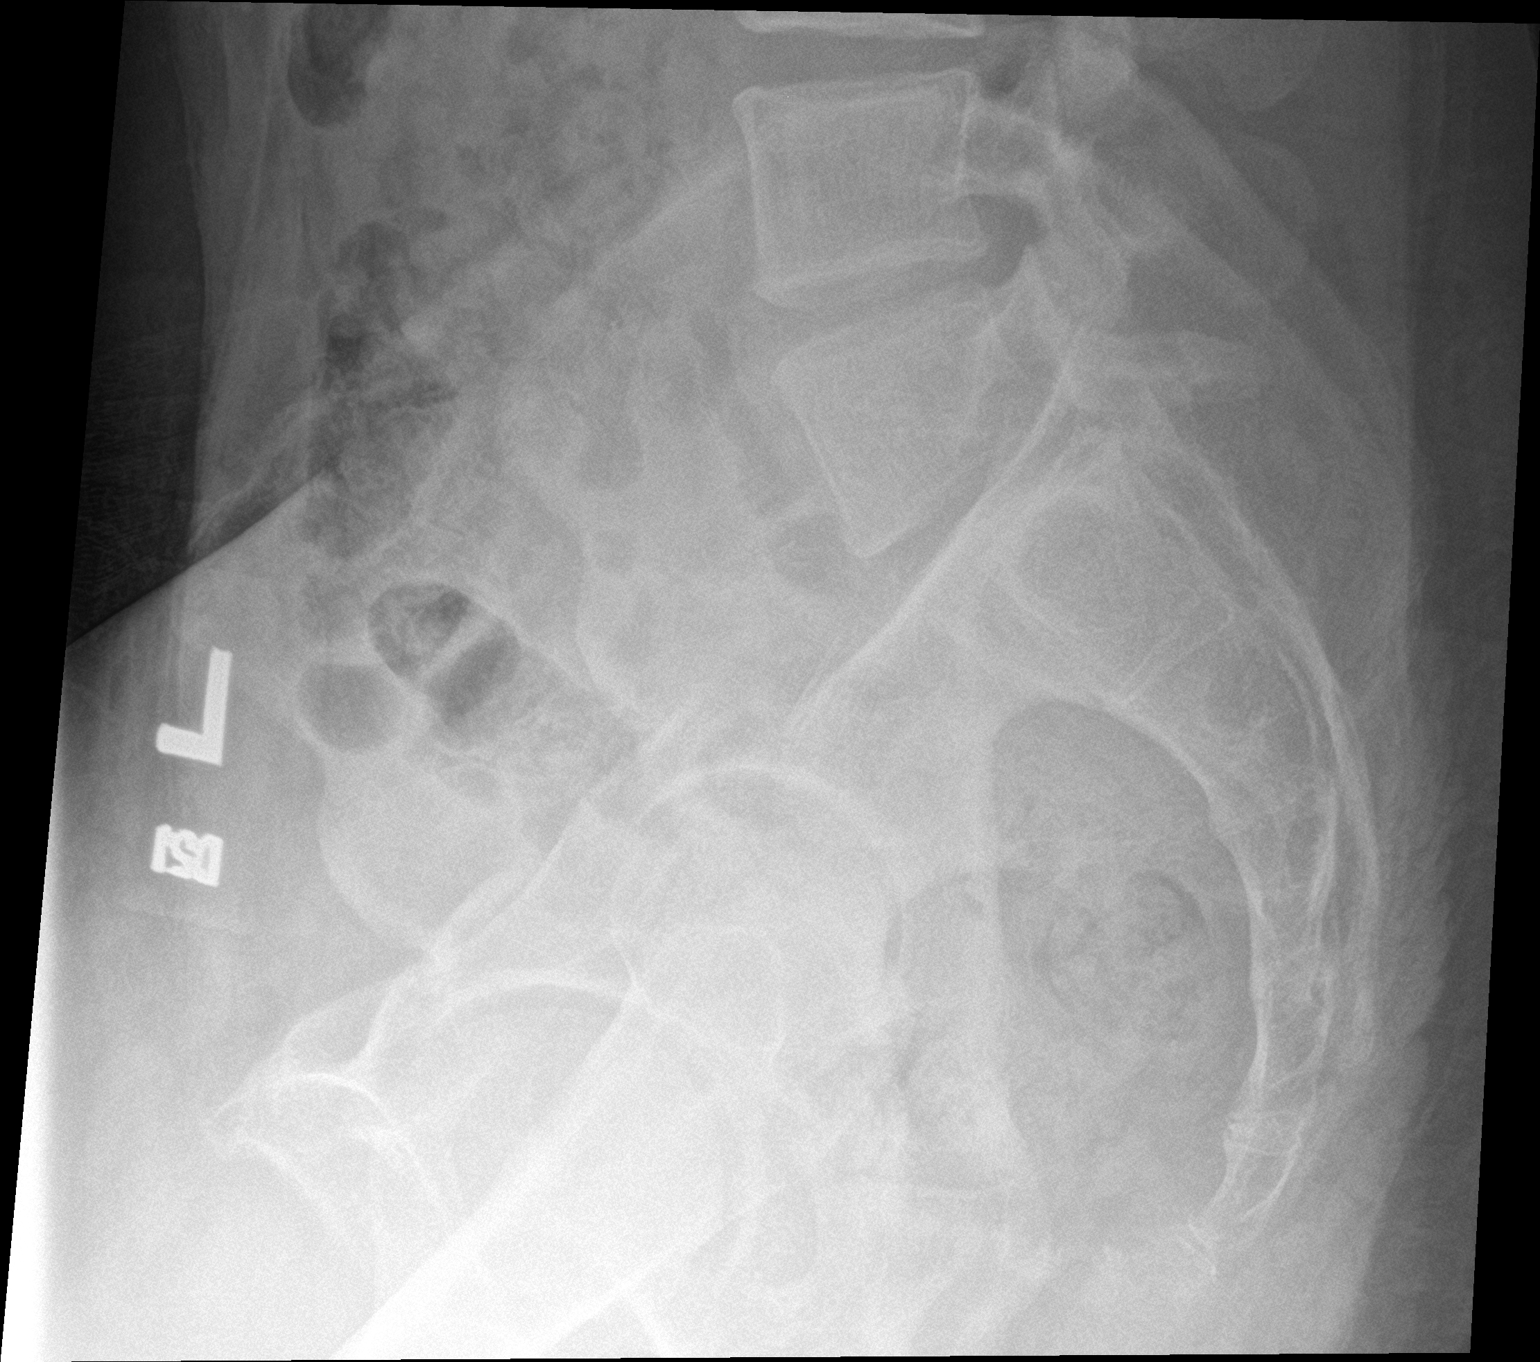

[3 of 3 positions shown; findings below may reference images not displayed]

FINDINGS: There is transitional lumbosacral anatomy with partial lumbarization
of S1 and a rudimentary disc at S1-2. No fracture is identified.
There may be minimal anterolisthesis of S1 on S2. Mild disc space
narrowing is present at L4-5 and L5-S1.
IMPRESSION: 1. No acute osseous abnormality identified.
2. Mild lower lumbar disc degeneration.
